# Patient Record
Sex: Male | Born: 1995 | Race: White | Hispanic: No | Marital: Single | Smoking: Never smoker
Health system: Southern US, Community
[De-identification: ages and names within clinical notes are randomized; demographics above are authoritative.]

## PROBLEM LIST (undated history)

## (undated) DIAGNOSIS — D6859 Other primary thrombophilia: Secondary | ICD-10-CM

## (undated) HISTORY — PX: HERNIA REPAIR: SHX51

---

## 2017-02-10 ENCOUNTER — Encounter (HOSPITAL_COMMUNITY): Payer: Self-pay | Admitting: Emergency Medicine

## 2017-02-10 ENCOUNTER — Other Ambulatory Visit: Payer: Self-pay

## 2017-02-10 ENCOUNTER — Emergency Department (HOSPITAL_COMMUNITY)
Admission: EM | Admit: 2017-02-10 | Discharge: 2017-02-10 | Disposition: A | Discharge status: Home / Self Care | Payer: Self-pay | Attending: Emergency Medicine | Admitting: Emergency Medicine

## 2017-02-10 ENCOUNTER — Emergency Department (HOSPITAL_COMMUNITY): Payer: Self-pay

## 2017-02-10 DIAGNOSIS — S0083XA Contusion of other part of head, initial encounter: Secondary | ICD-10-CM

## 2017-02-10 DIAGNOSIS — Z7901 Long term (current) use of anticoagulants: Secondary | ICD-10-CM | POA: Insufficient documentation

## 2017-02-10 DIAGNOSIS — S8011XA Contusion of right lower leg, initial encounter: Secondary | ICD-10-CM | POA: Insufficient documentation

## 2017-02-10 DIAGNOSIS — S0181XA Laceration without foreign body of other part of head, initial encounter: Secondary | ICD-10-CM | POA: Insufficient documentation

## 2017-02-10 DIAGNOSIS — W52XXXA Crushed, pushed or stepped on by crowd or human stampede, initial encounter: Secondary | ICD-10-CM | POA: Insufficient documentation

## 2017-02-10 DIAGNOSIS — R55 Syncope and collapse: Secondary | ICD-10-CM | POA: Insufficient documentation

## 2017-02-10 DIAGNOSIS — Y999 Unspecified external cause status: Secondary | ICD-10-CM | POA: Insufficient documentation

## 2017-02-10 DIAGNOSIS — Y9389 Activity, other specified: Secondary | ICD-10-CM | POA: Insufficient documentation

## 2017-02-10 DIAGNOSIS — Z5181 Encounter for therapeutic drug level monitoring: Secondary | ICD-10-CM | POA: Insufficient documentation

## 2017-02-10 DIAGNOSIS — Y9289 Other specified places as the place of occurrence of the external cause: Secondary | ICD-10-CM | POA: Insufficient documentation

## 2017-02-10 HISTORY — DX: Other primary thrombophilia: D68.59

## 2017-02-10 LAB — COMPREHENSIVE METABOLIC PANEL
ALT: 21 U/L (ref 17–63)
AST: 31 U/L (ref 15–41)
Albumin: 4.5 g/dL (ref 3.5–5.0)
Alkaline Phosphatase: 64 U/L (ref 38–126)
Anion gap: 10 (ref 5–15)
BUN: 15 mg/dL (ref 6–20)
CO2: 21 mmol/L — ABNORMAL LOW (ref 22–32)
Calcium: 9.3 mg/dL (ref 8.9–10.3)
Chloride: 104 mmol/L (ref 101–111)
Creatinine, Ser: 1 mg/dL (ref 0.61–1.24)
GFR calc Af Amer: >60 mL/min (ref >60)
GFR calc non Af Amer: >60 mL/min (ref >60)
Glucose, Bld: 168 mg/dL — ABNORMAL HIGH (ref 65–99)
Potassium: 4.1 mmol/L (ref 3.5–5.1)
Sodium: 135 mmol/L (ref 135–145)
Total Bilirubin: 0.7 mg/dL (ref 0.3–1.2)
Total Protein: 7.3 g/dL (ref 6.5–8.1)

## 2017-02-10 LAB — CBC WITH DIFFERENTIAL/PLATELET
Basophils Absolute: 0 10*3/uL (ref 0.0–0.1)
Basophils Relative: 0 %
Eosinophils Absolute: 0 10*3/uL (ref 0.0–0.7)
Eosinophils Relative: 0 %
HCT: 40.4 % (ref 39.0–52.0)
Hemoglobin: 13.3 g/dL (ref 13.0–17.0)
Lymphocytes Relative: 12 %
Lymphs Abs: 1.4 10*3/uL (ref 0.7–4.0)
MCH: 26.8 pg (ref 26.0–34.0)
MCHC: 32.9 g/dL (ref 30.0–36.0)
MCV: 81.5 fL (ref 78.0–100.0)
Monocytes Absolute: 0.5 10*3/uL (ref 0.1–1.0)
Monocytes Relative: 4 %
Neutro Abs: 10.3 10*3/uL — ABNORMAL HIGH (ref 1.7–7.7)
Neutrophils Relative %: 84 %
Platelets: 276 10*3/uL (ref 150–400)
RBC: 4.96 MIL/uL (ref 4.22–5.81)
RDW: 14.2 % (ref 11.5–15.5)
WBC: 12.3 10*3/uL — ABNORMAL HIGH (ref 4.0–10.5)

## 2017-02-10 LAB — PROTIME-INR
INR: 1.95
Prothrombin Time: 22.1 seconds — ABNORMAL HIGH (ref 11.4–15.2)

## 2017-02-10 MED ORDER — LIDOCAINE HCL (PF) 1 % IJ SOLN
5.0000 mL | Freq: Once | INTRAMUSCULAR | Status: AC
Start: 2017-02-10 — End: 2017-02-10
  Administered 2017-02-10: 5 mL via INTRADERMAL
  Filled 2017-02-10: qty 5
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
## 2017-02-10 NOTE — Discharge Instructions (Signed)
CT is your head and facial bones do not show any serious injuries.  You can clean your sutured wound with mild soap and warm water.  Do not scrub or soak though.  The bump you have in you lower extremity is a hematoma.  This should resolve over several days to up to a couple weeks.  I want you to continue your Coumadin at this time and continue to observe the area.  You can ice over the next 24 hours.
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
## 2017-02-10 NOTE — ED Notes (Signed)
Dr. Kohut at bedside at this time.  

## 2017-02-10 NOTE — ED Notes (Signed)
Patient transported to X-ray 
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
